# Patient Record
Sex: Male | Born: 1995 | Race: Black or African American | Hispanic: No | Marital: Single | Smoking: Never smoker
Health system: Southern US, Community
[De-identification: ages and names within clinical notes are randomized; demographics above are authoritative.]

## PROBLEM LIST (undated history)

## (undated) DIAGNOSIS — R55 Syncope and collapse: Secondary | ICD-10-CM

## (undated) DIAGNOSIS — M6282 Rhabdomyolysis: Secondary | ICD-10-CM

## (undated) DIAGNOSIS — E860 Dehydration: Secondary | ICD-10-CM

## (undated) DIAGNOSIS — N179 Acute kidney failure, unspecified: Secondary | ICD-10-CM

## (undated) HISTORY — PX: SHOULDER ARTHROSCOPY: SHX128

---

## 2013-12-23 ENCOUNTER — Ambulatory Visit
Admission: RE | Admit: 2013-12-23 | Discharge: 2013-12-23 | Disposition: A | Discharge status: Home / Self Care | Payer: Federal, State, Local not specified - PPO | Source: Ambulatory Visit | Attending: Family Medicine | Admitting: Family Medicine

## 2013-12-23 ENCOUNTER — Other Ambulatory Visit: Payer: Self-pay | Admitting: Family Medicine

## 2013-12-23 DIAGNOSIS — M25512 Pain in left shoulder: Secondary | ICD-10-CM

## 2014-12-01 ENCOUNTER — Other Ambulatory Visit: Payer: Self-pay | Admitting: Orthopedic Surgery

## 2014-12-01 DIAGNOSIS — M25512 Pain in left shoulder: Secondary | ICD-10-CM

## 2014-12-15 ENCOUNTER — Other Ambulatory Visit: Payer: Federal, State, Local not specified - PPO

## 2015-06-16 IMAGING — CT CT SHOULDER*L* W/O CM
3 series · 8 of 14 positions shown, 9 images · non-contrast
Comparison: None.

CLINICAL DATA: Remote left shoulder injury with persistent left
shoulder pain.

EXAM:
CT OF THE LEFT SHOULDER WITHOUT CONTRAST
TECHNIQUE: Multidetector CT imaging was performed according to the standard
protocol. Multiplanar CT image reconstructions were also generated.

[Series 3: left shoulder /std · axial · 0.47mm/px · z∈[-198,-132]mm · 2 of 80 slices shown, 3 images]
[im 27/80  soft-tissue]
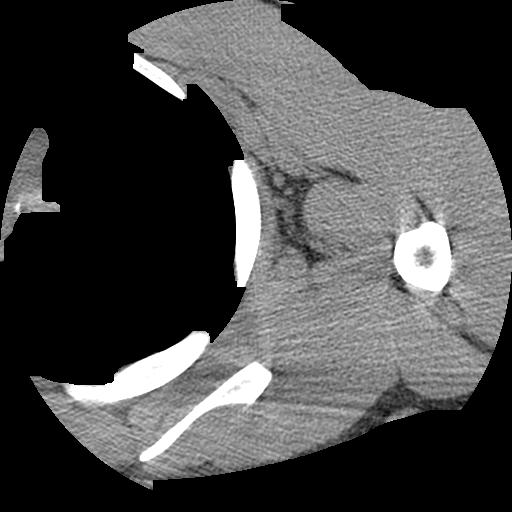
[im 27/80  bone]
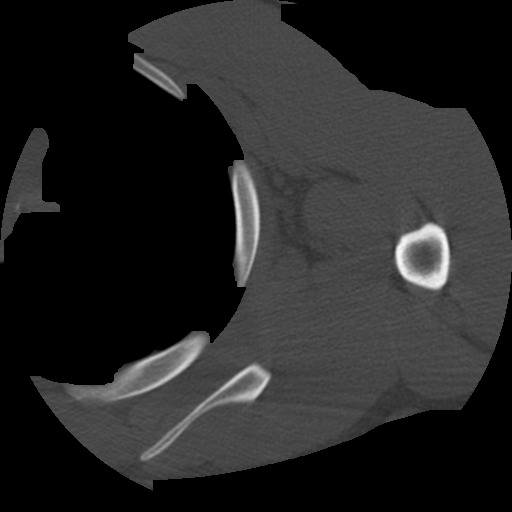
[im 53/80  bone]
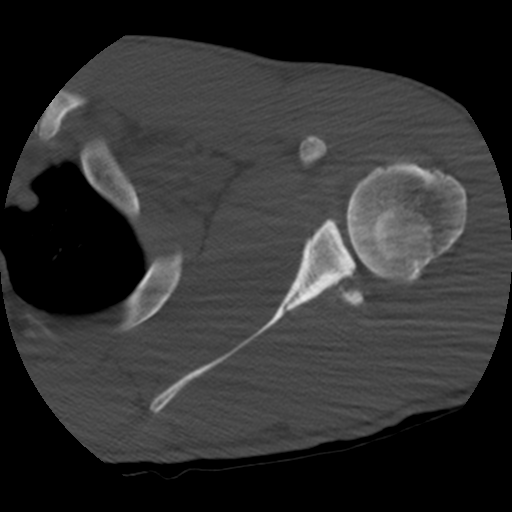

[Series 103: cor obl soft · oblique · 0.47mm/px · 3 of 121 slices shown]
[im 31/121  soft-tissue]
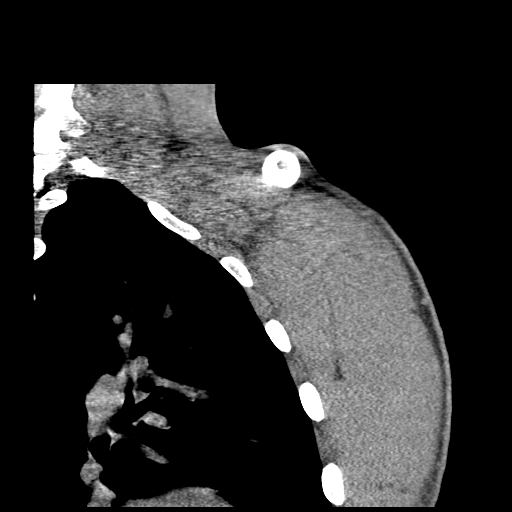
[im 61/121  soft-tissue]
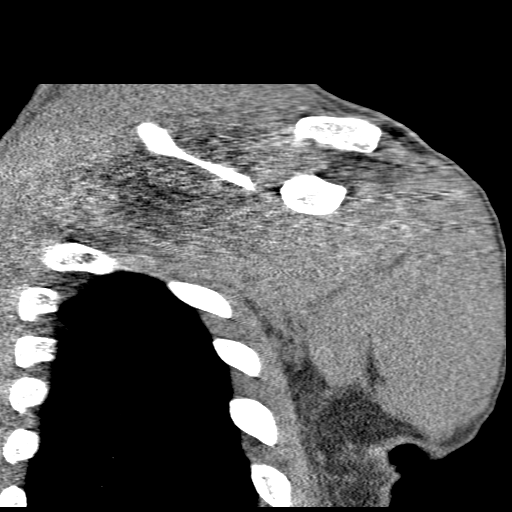
[im 91/121  soft-tissue]
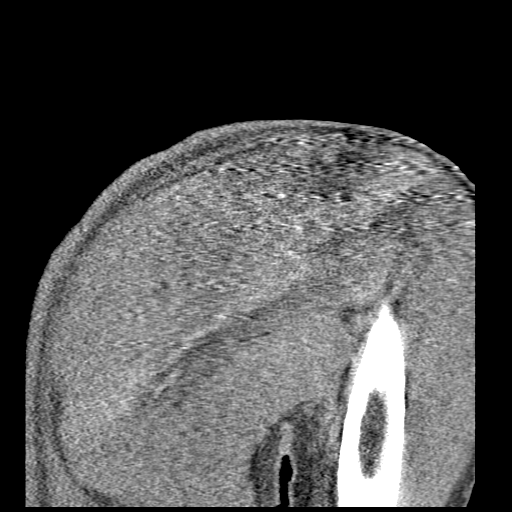

[Series 104: sag obl soft · oblique · 0.47mm/px · 3 of 121 slices shown]
[im 31/121  soft-tissue]
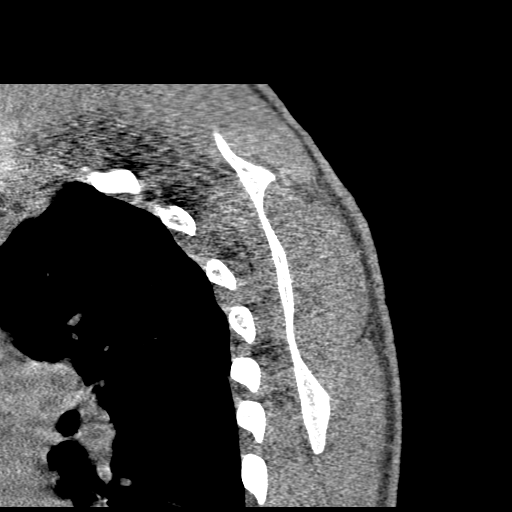
[im 61/121  soft-tissue]
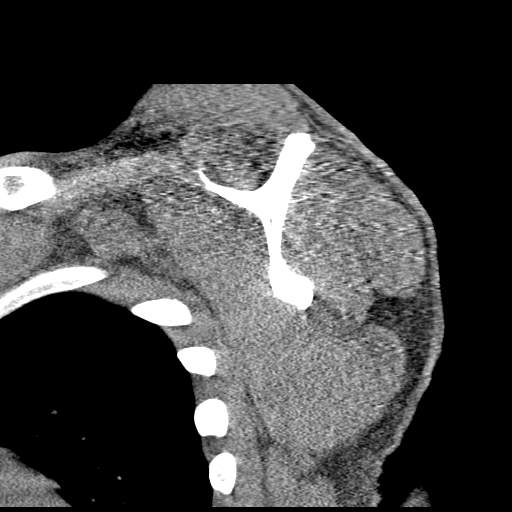
[im 91/121  soft-tissue]
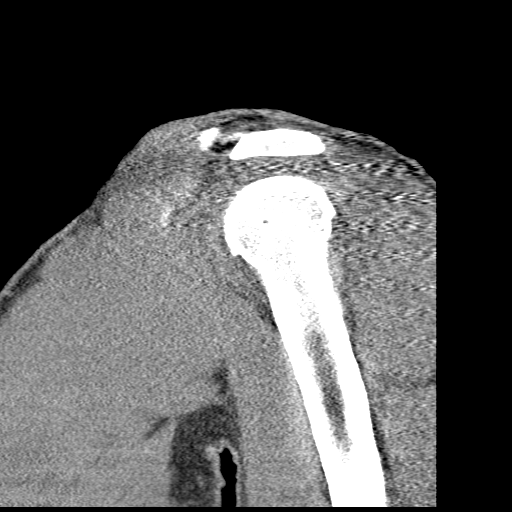

[8 of 14 positions shown; findings below may reference images not displayed]

FINDINGS: There is old ununited reverse Bankart fracture involving the
glenoid. The displaced fragment measures 21 x 15 x 8 mm. Maximum
displacement at the articular surface measures 4.5 mm.

The E humeral head is normally located. There are mild glenohumeral
joint degenerative changes with early osteophytic spurring involving
the humeral head neck junction area. The AC joint is intact. The
rotator cuff tendons are grossly normal.

The visualized left lung is clear.
IMPRESSION: Remote ununited reverse Bankart fracture involving the posterior
glenoid as discussed above. Please see 3D images.

Early glenohumeral joint degenerative changes.

## 2017-11-12 ENCOUNTER — Emergency Department (HOSPITAL_COMMUNITY): Payer: Federal, State, Local not specified - PPO

## 2017-11-12 ENCOUNTER — Inpatient Hospital Stay (HOSPITAL_COMMUNITY)
Admission: EM | Admit: 2017-11-12 | Discharge: 2017-11-14 | DRG: 558 | Disposition: A | Discharge status: Home / Self Care | Payer: Federal, State, Local not specified - PPO | Attending: Family Medicine | Admitting: Family Medicine

## 2017-11-12 ENCOUNTER — Other Ambulatory Visit: Payer: Self-pay

## 2017-11-12 ENCOUNTER — Encounter (HOSPITAL_COMMUNITY): Payer: Self-pay | Admitting: Internal Medicine

## 2017-11-12 DIAGNOSIS — I517 Cardiomegaly: Secondary | ICD-10-CM | POA: Diagnosis present

## 2017-11-12 DIAGNOSIS — R55 Syncope and collapse: Secondary | ICD-10-CM | POA: Diagnosis present

## 2017-11-12 DIAGNOSIS — E860 Dehydration: Secondary | ICD-10-CM | POA: Diagnosis present

## 2017-11-12 DIAGNOSIS — M6282 Rhabdomyolysis: Secondary | ICD-10-CM | POA: Diagnosis present

## 2017-11-12 DIAGNOSIS — N179 Acute kidney failure, unspecified: Secondary | ICD-10-CM | POA: Diagnosis present

## 2017-11-12 DIAGNOSIS — R9431 Abnormal electrocardiogram [ECG] [EKG]: Secondary | ICD-10-CM | POA: Diagnosis present

## 2017-11-12 DIAGNOSIS — I491 Atrial premature depolarization: Secondary | ICD-10-CM | POA: Diagnosis present

## 2017-11-12 DIAGNOSIS — R402360 Coma scale, best motor response, obeys commands, unspecified time: Secondary | ICD-10-CM | POA: Diagnosis present

## 2017-11-12 DIAGNOSIS — R402250 Coma scale, best verbal response, oriented, unspecified time: Secondary | ICD-10-CM | POA: Diagnosis present

## 2017-11-12 DIAGNOSIS — R402140 Coma scale, eyes open, spontaneous, unspecified time: Secondary | ICD-10-CM | POA: Diagnosis present

## 2017-11-12 DIAGNOSIS — R748 Abnormal levels of other serum enzymes: Secondary | ICD-10-CM | POA: Diagnosis present

## 2017-11-12 DIAGNOSIS — Z791 Long term (current) use of non-steroidal anti-inflammatories (NSAID): Secondary | ICD-10-CM

## 2017-11-12 HISTORY — DX: Syncope and collapse: R55

## 2017-11-12 HISTORY — DX: Acute kidney failure, unspecified: N17.9

## 2017-11-12 HISTORY — DX: Dehydration: E86.0

## 2017-11-12 HISTORY — DX: Rhabdomyolysis: M62.82

## 2017-11-12 LAB — BASIC METABOLIC PANEL
Anion gap: 12 (ref 5–15)
BUN: 21 mg/dL — AB (ref 6–20)
CALCIUM: 9.8 mg/dL (ref 8.9–10.3)
CO2: 25 mmol/L (ref 22–32)
CREATININE: 2.37 mg/dL — AB (ref 0.61–1.24)
Chloride: 103 mmol/L (ref 98–111)
GFR calc Af Amer: 43 mL/min — ABNORMAL LOW (ref >60)
GFR, EST NON AFRICAN AMERICAN: 37 mL/min — AB (ref >60)
GLUCOSE: 82 mg/dL (ref 70–99)
Potassium: 4.4 mmol/L (ref 3.5–5.1)
Sodium: 140 mmol/L (ref 135–145)

## 2017-11-12 LAB — URINALYSIS, ROUTINE W REFLEX MICROSCOPIC
Bilirubin Urine: NEGATIVE
GLUCOSE, UA: NEGATIVE mg/dL
KETONES UR: 20 mg/dL — AB
Leukocytes, UA: NEGATIVE
NITRITE: NEGATIVE
PH: 5 (ref 5.0–8.0)
Protein, ur: 100 mg/dL — AB
Specific Gravity, Urine: 1.031 — ABNORMAL HIGH (ref 1.005–1.030)

## 2017-11-12 LAB — RAPID URINE DRUG SCREEN, HOSP PERFORMED
AMPHETAMINES: NOT DETECTED
BARBITURATES: NOT DETECTED
Benzodiazepines: NOT DETECTED
COCAINE: NOT DETECTED
Opiates: NOT DETECTED
TETRAHYDROCANNABINOL: NOT DETECTED

## 2017-11-12 LAB — CBC
HCT: 45.5 % (ref 39.0–52.0)
HEMOGLOBIN: 15 g/dL (ref 13.0–17.0)
MCH: 28.9 pg (ref 26.0–34.0)
MCHC: 33 g/dL (ref 30.0–36.0)
MCV: 87.7 fL (ref 78.0–100.0)
PLATELETS: 194 10*3/uL (ref 150–400)
RBC: 5.19 MIL/uL (ref 4.22–5.81)
RDW: 12.8 % (ref 11.5–15.5)
WBC: 8.3 10*3/uL (ref 4.0–10.5)

## 2017-11-12 LAB — TSH: TSH: 0.725 u[IU]/mL (ref 0.350–4.500)

## 2017-11-12 LAB — CK: Total CK: 9983 U/L — ABNORMAL HIGH (ref 49–397)

## 2017-11-12 LAB — CBG MONITORING, ED: Glucose-Capillary: 74 mg/dL (ref 70–99)

## 2017-11-12 MED ORDER — SODIUM CHLORIDE 0.9 % IV SOLN
INTRAVENOUS | Status: DC
  Administered 2017-11-12 – 2017-11-14 (×6): via INTRAVENOUS

## 2017-11-12 MED ORDER — SODIUM CHLORIDE 0.9 % IV BOLUS
1000.0000 mL | Freq: Once | INTRAVENOUS | Status: AC
  Administered 2017-11-12: 1000 mL via INTRAVENOUS

## 2017-11-12 MED ORDER — SODIUM CHLORIDE 0.9 % IV BOLUS
2000.0000 mL | Freq: Once | INTRAVENOUS | Status: AC
  Administered 2017-11-12: 2000 mL via INTRAVENOUS

## 2017-11-12 MED ORDER — HEPARIN SODIUM (PORCINE) 5000 UNIT/ML IJ SOLN
5000.0000 [IU] | Freq: Three times a day (TID) | INTRAMUSCULAR | Status: DC
  Administered 2017-11-12 – 2017-11-13 (×2): 5000 [IU] via SUBCUTANEOUS
  Filled 2017-11-12 (×2): qty 1

## 2017-11-12 NOTE — ED Provider Notes (Signed)
[facility] EMERGENCY DEPARTMENT Provider Note   CSN: [id number] Arrival date & time: 11/12/17  1226     History   Chief Complaint Chief Complaint  Patient presents with  . Loss of Consciousness    HPI George Ramirez is a 22 y.o. male.  HPI Patient presents to the emergency department with a syncopal episode that occurred right after football practice.  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  The patient states that he was not feeling bad prior to this episode.  Patient states that have been practicing very hard over the last week.  Patient states nothing seemed to make the condition better or worse.  The patient denies chest pain, shortness of breath, headache,blurred vision, neck pain, fever, cough, weakness, numbness, dizziness, anorexia, edema, abdominal pain, nausea, vomiting, diarrhea, rash, back pain, dysuria, hematemesis, bloody stool,  History reviewed. No pertinent past medical history.  There are no active problems to display for this patient.   History reviewed. No pertinent surgical history.      Home Medications    Prior to Admission medications   Medication Sig Start Date End Date Taking? Authorizing Provider  naproxen sodium (ALEVE) 220 MG tablet Take 440 mg by mouth.   Yes [provider]    Family History No family history on file.  Social History Social History   Tobacco Use  . Smoking status: Not on file  Substance Use Topics  . Alcohol use: Not on file  . Drug use: Not on file     Allergies   Patient has no known allergies.   Review of Systems Review of Systems All other systems negative except as documented in the HPI. All pertinent positives and negatives as reviewed in the HPI.  Physical Exam Updated Vital Signs BP 136/67   Pulse 86   Temp 98.4 F (36.9 C) (Oral)   Resp (!) 22   Ht 6\' 3"  (1.905 m)   Wt 115.7 kg (255 lb)   SpO2 99%   BMI 31.87 kg/m    Physical Exam  Constitutional: He is oriented to person, place, and time. He appears well-developed and well-nourished. No distress.  HENT:  Head: Normocephalic and atraumatic.  Mouth/Throat: Oropharynx is clear and moist.  Eyes: Pupils are equal, round, and reactive to light.  Neck: Normal range of motion. Neck supple.  Cardiovascular: Normal rate, regular rhythm and normal heart sounds. Exam reveals no gallop and no friction rub.  No murmur heard. Pulmonary/Chest: Effort normal and breath sounds normal. No respiratory distress. He has no wheezes.  Abdominal: Soft. Bowel sounds are normal. He exhibits no distension. There is no tenderness.  Neurological: He is alert and oriented to person, place, and time. He exhibits normal muscle tone. Coordination normal.  Skin: Skin is warm and dry. Capillary refill takes less than 2 seconds. No rash noted. No erythema.  Psychiatric: He has a normal mood and affect. His behavior is normal.  Nursing note and vitals reviewed.    ED Treatments / Results  Labs (all labs ordered are listed, but only abnormal results are displayed) Labs Reviewed  BASIC METABOLIC PANEL - Abnormal; Notable for the following components:      Result Value   BUN 21 (*)    Creatinine, Ser 2.37 (*)    GFR calc non Af Amer 37 (*)    GFR calc Af Amer 43 (*)    All other components within normal limits  CK - Abnormal; Notable for the following components:   Total CK 9,983 (*)    All other components within normal limits  CBC  URINALYSIS, ROUTINE W REFLEX MICROSCOPIC  RAPID URINE DRUG SCREEN, HOSP PERFORMED  CBG MONITORING, ED    EKG EKG Interpretation  Date/Time:  Wednesday November 12 2017 12:41:51 EDT Ventricular Rate:  93 PR Interval:    QRS Duration: 73 QT Interval:  340 QTC Calculation: 423 R Axis:   78 Text Interpretation:  Sinus rhythm Atrial premature complex LVH by voltage Inferior infarct, acute (LCx) Anterior ST elevation, probably due to LVH Lateral  leads are also involved No previous tracing Confirmed by Gwyneth SproutPlunkett, Whitney ([id number]) on 11/12/2017 2:00:48 PM   Radiology Dg Chest 2 View  Result Date: 11/12/2017 CLINICAL DATA:  Syncope. EXAM: CHEST - 2 VIEW COMPARISON:  None. FINDINGS: The heart size and mediastinal contours are within normal limits. Both lungs are clear. No pneumothorax or pleural effusion is noted. The visualized skeletal structures are unremarkable. IMPRESSION: No active cardiopulmonary disease. Electronically Signed   By: Lupita RaiderJames  Green Jr, M.D.   On: 11/12/2017 13:41    Procedures Procedures (including critical care time)  Medications Ordered in ED Medications  sodium chloride 0.9 % bolus 1,000 mL (1,000 mLs Intravenous New Bag/Given 11/12/17 1458)  sodium chloride 0.9 % bolus 2,000 mL (2,000 mLs Intravenous New Bag/Given 11/12/17 1419)     Initial Impression / Assessment and Plan / ED Course  I have reviewed the triage vital signs and the nursing notes.  Pertinent labs & imaging results that were available during my care of the patient were reviewed by me and considered in my medical decision making (see chart for details).    CRITICAL CARE Performed by: Jamesetta Orleanshristopher W Danese Dorsainvil Total critical care time: 40 minutes Critical care time was exclusive of separately billable procedures and treating other patients. Critical care was necessary to treat or prevent imminent or life-threatening deterioration. Critical care was time spent personally by me on the following activities: development of treatment plan with patient and/or surrogate as well as nursing, discussions with consultants, evaluation of patient's response to treatment, examination of patient, obtaining history from patient or surrogate, ordering and performing treatments and interventions, ordering and review of laboratory studies, ordering and review of radiographic studies, pulse oximetry and re-evaluation of patient's condition.  Patient be treated in the hospital  for rhabdomyolysis.  I was given multiple rounds of IV fluids here in the emergency department.  Patient is remained stable and his vital signs have been stable as well.  Patient is advised the plan and all questions were answered.  Spoke with the [facility] who will admit the patient for further evaluation. Final Clinical Impressions(s) / ED Diagnoses   Final diagnoses:  None    ED Discharge Orders    None       Charlestine NightLawyer, Kadeja Granada, PA-C 11/12/17 1545    Gwyneth SproutPlunkett, Whitney, MD 11/14/17 1445

## 2017-11-12 NOTE — ED Notes (Addendum)
Phlebotomy aware of need for blood draw

## 2017-11-12 NOTE — ED Notes (Signed)
Patient transported to X-ray 

## 2017-11-12 NOTE — ED Notes (Signed)
Attempted report x1. 

## 2017-11-12 NOTE — ED Notes (Signed)
Pt given urinal and instructed to use it to void. Pt aware of need for urine specimen.

## 2017-11-12 NOTE — H&P (Signed)
History and Physical    George Ramirez [id number] DOB: 1995-08-23 DOA: 11/12/2017  PCP: Patient, No Pcp Per Patient coming from: home ([facility] [employer] [facility])  Chief Complaint: syncope  HPI: George Ramirez is a 22 y.o. male with no past medical hx presents to the emergency Department chief complaint of syncope. Initial evaluation reveals acute kidney injury likely, elevated CK related to dehydration in setting of football practice. [facility] are asked to admit.  Information is obtained from the patient and the chart. Patient states the ball practice began last week and they practiced "without gear" for 1 week. This week today being the third day they practiced "with gear and hitting". Patient states he's been in his usual state of health eating and drinking his normal amount. He does characterize the practices as "hard". He remembers going to the locker room and being on the ground. He said he could hear people calling his name but he could not respond. He denies headache dizziness chest pain palpitation shortness of breath. He denies any nausea vomiting diarrhea. He denies dysuria hematuria frequency or urgency. Numbness or tingling of his extremities. No headache or visual disturbances. No diarrhea constipation melena or bright red blood per rectum.he denies any performance enhancement supplements   ED Course: in the emergency department he's afebrile hemodynamically stable and not hypoxic. He is provided with 3 L of normal saline.  Review of Systems: As per HPI otherwise all other systems reviewed and are negative.   Ambulatory Status: ambulates independently is independent with ADLs  Past Medical History:  Diagnosis Date  . Acute kidney injury (HCC)   . Dehydration   . Rhabdomyolysis   . Syncope     History reviewed. No pertinent surgical history.  Social History   Socioeconomic History  . Marital status: Single    Spouse name: Not on file  . Number of children: Not on file  .  Years of education: Not on file  . Highest education level: Not on file  Occupational History  . Not on file  Social Needs  . Financial resource strain: Not on file  . Food insecurity:    Worry: Not on file    Inability: Not on file  . Transportation needs:    Medical: Not on file    Non-medical: Not on file  Tobacco Use  . Smoking status: Not on file  Substance and Sexual Activity  . Alcohol use: Not on file  . Drug use: Not on file  . Sexual activity: Not on file  Lifestyle  . Physical activity:    Days per week: Not on file    Minutes per session: Not on file  . Stress: Not on file  Relationships  . Social connections:    Talks on phone: Not on file    Gets together: Not on file    Attends religious service: Not on file    Active member of club or organization: Not on file    Attends meetings of clubs or organizations: Not on file    Relationship status: Not on file  . Intimate partner violence:    Fear of current or ex partner: Not on file    Emotionally abused: Not on file    Physically abused: Not on file    Forced sexual activity: Not on file  Other Topics Concern  . Not on file  Social History Narrative  . Not on file    No Known Allergies  Family History  Problem Relation  Age of Onset  . Hypertension Mother     Prior to Admission medications   Medication Sig Start Date End Date Taking? Authorizing Provider  naproxen sodium (ALEVE) 220 MG tablet Take 440 mg by mouth.   Yes [provider]    Physical Exam: Vitals:   11/12/17 1232 11/12/17 1236 11/12/17 1403 11/12/17 1430  BP:   136/80 136/67  Pulse:   81 86  Resp:   13 (!) 22  Temp:   98.4 F (36.9 C)   TempSrc:   Oral   SpO2: 98%  98% 99%  Weight:  115.7 kg (255 lb)    Height:  6\' 3"  (1.905 m)       General:  Appears calm and comfortable lying in bed in no acute distress Eyes:  PERRL, EOMI, normal lids, iris ENT:  grossly normal hearing, lips & tongue, mucous membranes of his  mouth are moist and pink Neck:  no LAD, masses or thyromegaly Cardiovascular:  RRR, no m/r/g. No LE edema.  Respiratory:  CTA bilaterally, no w/r/r. Normal respiratory effort. Abdomen:  soft, ntnd, NABS Skin:  no rash or induration seen on limited exam Musculoskeletal:  grossly normal tone BUE/BLE, good ROM, no bony abnormality Psychiatric:  grossly normal mood and affect, speech fluent and appropriate, AOx3 Neurologic:  CN 2-12 grossly intact, moves all extremities in coordinated fashion, sensation intact  Labs on Admission: I have personally reviewed following labs and imaging studies  CBC: Recent Labs  Lab 11/12/17 1239  WBC 8.3  HGB 15.0  HCT 45.5  MCV 87.7  PLT 194   Basic Metabolic Panel: Recent Labs  Lab 11/12/17 1239  NA 140  K 4.4  CL 103  CO2 25  GLUCOSE 82  BUN 21*  CREATININE 2.37*  CALCIUM 9.8   GFR: Estimated Creatinine Clearance: 66.5 mL/min (A) (by C-G formula based on SCr of 2.37 mg/dL (H)). Liver Function Tests: No results for input(s): AST, ALT, ALKPHOS, BILITOT, PROT, ALBUMIN in the last 168 hours. No results for input(s): LIPASE, AMYLASE in the last 168 hours. No results for input(s): AMMONIA in the last 168 hours. Coagulation Profile: No results for input(s): INR, PROTIME in the last 168 hours. Cardiac Enzymes: Recent Labs  Lab 11/12/17 1239  CKTOTAL 9,983*   BNP (last 3 results) No results for input(s): PROBNP in the last 8760 hours. HbA1C: No results for input(s): HGBA1C in the last 72 hours. CBG: Recent Labs  Lab 11/12/17 1300  GLUCAP 74   Lipid Profile: No results for input(s): CHOL, HDL, LDLCALC, TRIG, CHOLHDL, LDLDIRECT in the last 72 hours. Thyroid Function Tests: No results for input(s): TSH, T4TOTAL, FREET4, T3FREE, THYROIDAB in the last 72 hours. Anemia Panel: No results for input(s): VITAMINB12, FOLATE, FERRITIN, TIBC, IRON, RETICCTPCT in the last 72 hours. Urine analysis: No results found for: COLORURINE,  APPEARANCEUR, LABSPEC, PHURINE, GLUCOSEU, HGBUR, BILIRUBINUR, KETONESUR, PROTEINUR, UROBILINOGEN, NITRITE, LEUKOCYTESUR  Creatinine Clearance: Estimated Creatinine Clearance: 66.5 mL/min (A) (by C-G formula based on SCr of 2.37 mg/dL (H)).  Sepsis Labs: @LABRCNTIP (procalcitonin:4,lacticidven:4) )No results found for this or any previous visit (from the past 240 hour(s)).   Radiological Exams on Admission: Dg Chest 2 View  Result Date: 11/12/2017 CLINICAL DATA:  Syncope. EXAM: CHEST - 2 VIEW COMPARISON:  None. FINDINGS: The heart size and mediastinal contours are within normal limits. Both lungs are clear. No pneumothorax or pleural effusion is noted. The visualized skeletal structures are unremarkable. IMPRESSION: No active cardiopulmonary disease. Electronically Signed   By:  Lupita Raider, M.D.   On: 11/12/2017 13:41    EKG: NSR  Assessment/Plan Principal Problem:   Syncope and collapse Active Problems:   Dehydration   Acute kidney injury (HCC)   Rhabdomyolysis   Elevated CK   #1. Syncope and collapse. Likely related to aki secondary to dehydration. ekg without acute changes. Chest xray no active cardiopulmonary disease. CK 9982, creatinine 2.37. Neuro exam benign. Hemodynamically stable and not hypoxic appeearing -admit to tele -continue vigorous IV fluids -check orthostatic vs  #2. Elevated CK/rhabdomyolosis. Secondary to dehydration. CK 9982 on admission. Denies decreased oral intake. Likely secondary to football practice in heat. He is provided 3L NS in ED -continue vigorous IV fluids -monitor intake and output -recheck in am  #3. AKI. Related to dehydration. Creatinine 2.37. Bun 21.  -hold nephrotoxins -vigorous IV fluids as noted above -follow urinalysis -follow UDS -monitor urine output -recheck in am  DVT prophylaxis: heparin  Code Status: full  Family Communication: none present  Disposition Plan: home 24-36 hous  Consults called: none  Admission status:  inpatient    Gwenyth Bender NP [facility]  If 7PM-7AM, please contact night-coverage [email or web address] Password [facility]  11/12/2017, 4:29 PM

## 2017-11-12 NOTE — ED Triage Notes (Signed)
Pt here via [facility] after an unwitnessed syncopal episode in [facility] [employer] this afternoon. BLS truck arrived initially and provided respiration assistance d/t pt shallow respirations. EMS reports pt was unresponsive to painful stimuli for approx 5 minutes. Pt began moaning to verbal stimuli and is now A/Ox4. Pt given 400mLs fluid by EMS. Vital signs stable at this time. Patient denies pain, reports feeling "tired."

## 2017-11-13 DIAGNOSIS — E860 Dehydration: Secondary | ICD-10-CM

## 2017-11-13 DIAGNOSIS — M6282 Rhabdomyolysis: Principal | ICD-10-CM

## 2017-11-13 DIAGNOSIS — R748 Abnormal levels of other serum enzymes: Secondary | ICD-10-CM

## 2017-11-13 DIAGNOSIS — N179 Acute kidney failure, unspecified: Secondary | ICD-10-CM

## 2017-11-13 LAB — BASIC METABOLIC PANEL
Anion gap: 10 (ref 5–15)
BUN: 20 mg/dL (ref 6–20)
CHLORIDE: 106 mmol/L (ref 98–111)
CO2: 25 mmol/L (ref 22–32)
CREATININE: 1.5 mg/dL — AB (ref 0.61–1.24)
Calcium: 8.2 mg/dL — ABNORMAL LOW (ref 8.9–10.3)
GFR calc Af Amer: >60 mL/min (ref >60)
GFR calc non Af Amer: >60 mL/min (ref >60)
Glucose, Bld: 85 mg/dL (ref 70–99)
POTASSIUM: 3.8 mmol/L (ref 3.5–5.1)
SODIUM: 141 mmol/L (ref 135–145)

## 2017-11-13 LAB — CK: Total CK: 6206 U/L — ABNORMAL HIGH (ref 49–397)

## 2017-11-13 LAB — CBC
HEMATOCRIT: 40.1 % (ref 39.0–52.0)
Hemoglobin: 12.9 g/dL — ABNORMAL LOW (ref 13.0–17.0)
MCH: 28.9 pg (ref 26.0–34.0)
MCHC: 32.2 g/dL (ref 30.0–36.0)
MCV: 89.7 fL (ref 78.0–100.0)
PLATELETS: 157 10*3/uL (ref 150–400)
RBC: 4.47 MIL/uL (ref 4.22–5.81)
RDW: 12.9 % (ref 11.5–15.5)
WBC: 7.1 10*3/uL (ref 4.0–10.5)

## 2017-11-13 LAB — HIV ANTIBODY (ROUTINE TESTING W REFLEX): HIV SCREEN 4TH GENERATION: NONREACTIVE

## 2017-11-13 NOTE — Progress Notes (Signed)
  PROGRESS NOTE  George Ramirez [id number] DOB: 11-01-1995 DOA: 11/12/2017 PCP: Patient, No Pcp Per  Brief Narrative: 22 year old man who had an episode of syncope during football practice.  Admitted for rhabdomyolysis with acute kidney injury, dehydration.  Assessment/Plan Rhabdomyolysis with elevated CK, acute kidney injury. --CK trending down with aggressive hydration but still elevated. --Continue aggressive IV hydration, check CK in a.m.  Acute kidney injury, dehydration, secondary to vigorous outdoor exercise --Urine output adequate, renal function improving. --Continue vigorous IV fluids, will increase rate to 200 mL/h --Check BMP in a.m.  Syncope in the context of vigorous outdoor exercise, dehydration, acute kidney injury. --EKG showed sinus rhythm with LVH and repolarization abnormality. --No further inpatient evaluation planned.   DVT prophylaxis: early ambulation Code Status: Full Family Communication: none Disposition Plan: likely home 8/9    Brendia Sacksaniel Goodrich, MD  [facility] Direct contact: [phone] --Via amion app OR  --[email or web address]; password [email or web address]  7PM-7AM contact night coverage as above 11/13/2017, 11:25 AM  LOS: 1 day   Consultants:    Procedures:    Antimicrobials:    Interval history/Subjective: Feels good, no complaints.  Urine getting back to normal  Objective: Vitals:  Vitals:   11/13/17 0507 11/13/17 0842  BP: 123/79 (!) 158/96  Pulse: 65 72  Resp: 18 18  Temp: 98 F (36.7 C) 98.4 F (36.9 C)  SpO2: 100% 97%    Exam:  Constitutional:  . Appears calm and comfortable Respiratory:  . CTA bilaterally, no w/r/r.  . Respiratory effort normal. Cardiovascular:  . RRR, no m/r/g . No LE extremity edema   Psychiatric:  . Mental status o Mood, affect appropriate I have personally reviewed the following:  Urine output 1900 Labs:  Creatinine trending down 2.37, 1.50  Total CK trending down 9983, 6206  CBC  unremarkable  TSH unremarkable  Scheduled Meds: . heparin  5,000 Units Subcutaneous Q8H   Continuous Infusions: . sodium chloride 200 mL/hr at 11/13/17 1057    Principal Problem:   Rhabdomyolysis Active Problems:   Dehydration   Acute kidney injury (HCC)   Syncope and collapse   Elevated CK   LOS: 1 day

## 2017-11-14 LAB — BASIC METABOLIC PANEL
ANION GAP: 10 (ref 5–15)
BUN: 11 mg/dL (ref 6–20)
CHLORIDE: 101 mmol/L (ref 98–111)
CO2: 28 mmol/L (ref 22–32)
Calcium: 9.3 mg/dL (ref 8.9–10.3)
Creatinine, Ser: 1.17 mg/dL (ref 0.61–1.24)
GFR calc non Af Amer: >60 mL/min (ref >60)
GLUCOSE: 104 mg/dL — AB (ref 70–99)
POTASSIUM: 4 mmol/L (ref 3.5–5.1)
Sodium: 139 mmol/L (ref 135–145)

## 2017-11-14 LAB — CK: Total CK: 3552 U/L — ABNORMAL HIGH (ref 49–397)

## 2017-11-14 NOTE — Progress Notes (Signed)
Patient Discharge: Disposition: Patient discharged to home. Education: Reviewed discharge instructions, verbalized understanding. IV: Discontinued IV before discharge. Telemetry: N/A Transportation: Patient walked out of the unit and refused to be escorted. Belongings: Patient took all his belongings with him.

## 2017-11-14 NOTE — Discharge Summary (Signed)
Physician Discharge Summary  Shannan HarperJustin Belvin [id number] DOB: [birth year] DOA: 11/12/2017  PCP: Patient, No Pcp Per  Admit date: 11/12/2017 Discharge date: 11/14/2017  Recommendations for Outpatient Follow-up:  1. Routine care     Discharge Diagnoses:  1. Rhabdomyolysis with elevated CK, acute kidney injury. 2. Acute kidney injury, dehydration 3. Syncope in the context of vigorous outdoor exercise, dehydration, acute kidney injury.  Discharge Condition: improved Disposition: home  Diet recommendation: regular  Filed Weights   11/12/17 1236  Weight: 115.7 kg    History of present illness:  22 year old man who had an episode of syncope during football practice.  Admitted for rhabdomyolysis with acute kidney injury, dehydration.  Hospital Course:  Patient was treated with aggressive IV fluids with gradual clinical improvement.  No complications noted.  Individual issues as below.  Rhabdomyolysis with elevated CK, acute kidney injury. --Treated aggressively with IV fluids.  Renal function is returned to baseline.  CK trending down rapidly.  Acute kidney injury, dehydration, secondary to vigorous outdoor exercise --With vigorous IV fluids.  Syncope in the context of vigorous outdoor exercise, dehydration, acute kidney injury. --EKG showed sinus rhythm with LVH and repolarization abnormality. --No further inpatient evaluation planned.  Today's assessment: S: feels well, no complaints. O: Vitals:  Vitals:   11/14/17 0455 11/14/17 0919  BP: 135/83 (!) 158/83  Pulse: (!) 57 82  Resp: 16 18  Temp: 97.9 F (36.6 C) 98.5 F (36.9 C)  SpO2: 100% 100%    Constitutional:  . Appears calm and comfortable Respiratory:  . CTA bilaterally, no w/r/r.  . Respiratory effort normal. Cardiovascular:  . RRR, no m/r/g . No LE extremity edema   Psychiatric:  . Mental status o Mood, affect appropriate  CK down to 3552 Creatinine down to 1.17  Discharge  Instructions  Discharge Instructions    Activity as tolerated - No restrictions   Complete by:  As directed    Diet general   Complete by:  As directed    Discharge instructions   Complete by:  As directed    Call your physician or seek immediate medical attention for pain, decreased urination, swelling, dark urine or worsening of condition.     Allergies as of 11/14/2017   No Known Allergies     Medication List    STOP taking these medications   naproxen sodium 220 MG tablet Commonly known as:  ALEVE      No Known Allergies  The results of significant diagnostics from this hospitalization (including imaging, microbiology, ancillary and laboratory) are listed below for reference.    Significant Diagnostic Studies: Dg Chest 2 View  Result Date: 11/12/2017 CLINICAL DATA:  Syncope. EXAM: CHEST - 2 VIEW COMPARISON:  None. FINDINGS: The heart size and mediastinal contours are within normal limits. Both lungs are clear. No pneumothorax or pleural effusion is noted. The visualized skeletal structures are unremarkable. IMPRESSION: No active cardiopulmonary disease. Electronically Signed   By: Lupita RaiderJames  Green Jr, M.D.   On: 11/12/2017 13:41    Labs: Basic Metabolic Panel: Recent Labs  Lab 11/12/17 1239 11/13/17 0509 11/14/17 0903  NA 140 141 139  K 4.4 3.8 4.0  CL 103 106 101  CO2 25 25 28   GLUCOSE 82 85 104*  BUN 21* 20 11  CREATININE 2.37* 1.50* 1.17  CALCIUM 9.8 8.2* 9.3   CBC: Recent Labs  Lab 11/12/17 1239 11/13/17 0509  WBC 8.3 7.1  HGB 15.0 12.9*  HCT 45.5 40.1  MCV 87.7 89.7  PLT 194  157   Cardiac Enzymes: Recent Labs  Lab 11/12/17 1239 11/13/17 0509 11/14/17 0903  CKTOTAL 9,983* 6,206* 3,552*    CBG: Recent Labs  Lab 11/12/17 1300  GLUCAP 74    Principal Problem:   Rhabdomyolysis Active Problems:   Dehydration   Acute kidney injury (HCC)   Syncope and collapse   Elevated CK   Time coordinating discharge: 20 minutes  Signed:  Brendia Sacks, MD [facility] 11/14/2017, 3:08 PM

## 2019-05-06 IMAGING — CR DG CHEST 2V
2 series · 2 of 2 positions shown · non-contrast
Comparison: None.

CLINICAL DATA: Syncope.

EXAM:
CHEST - 2 VIEW

[chest pa]
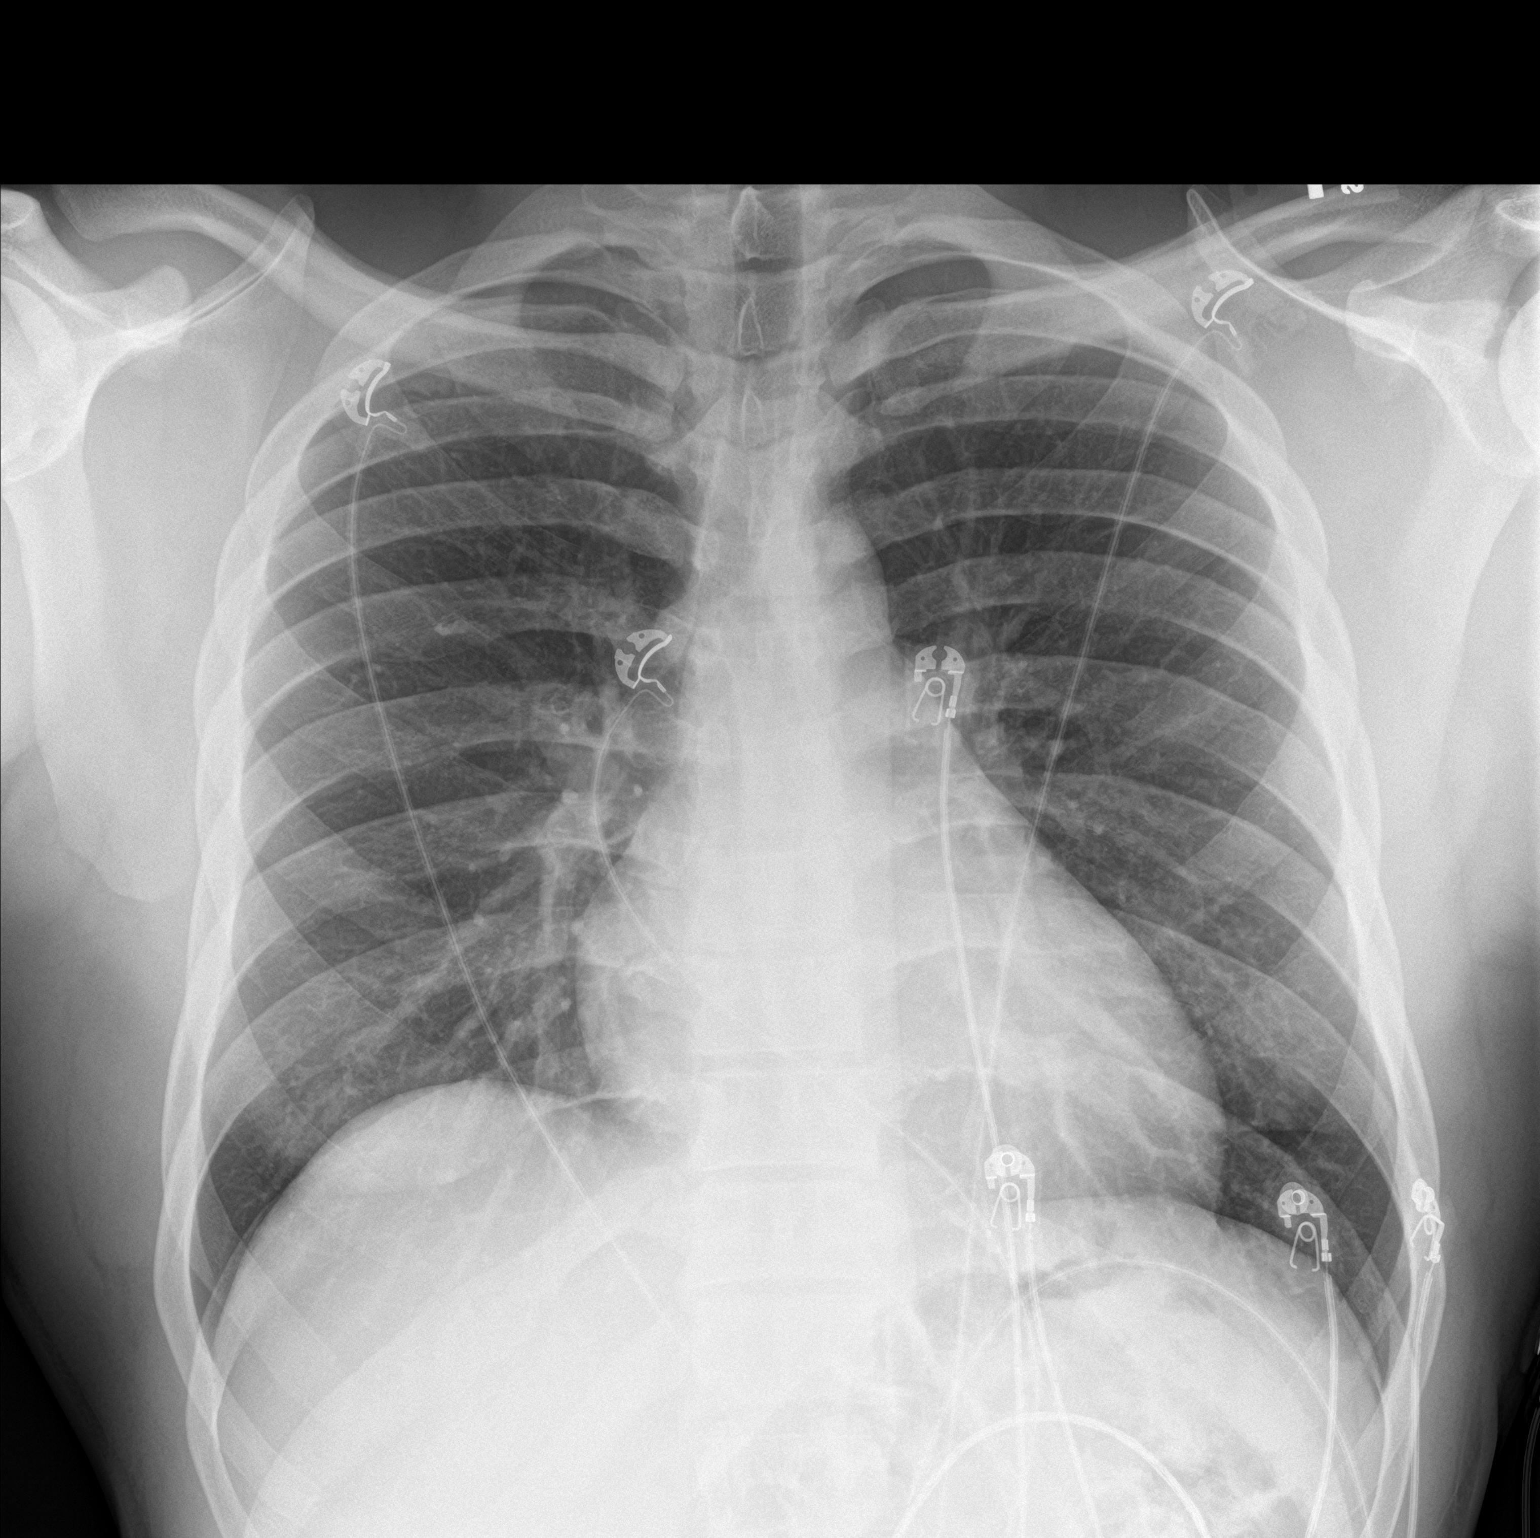

[chest lat]
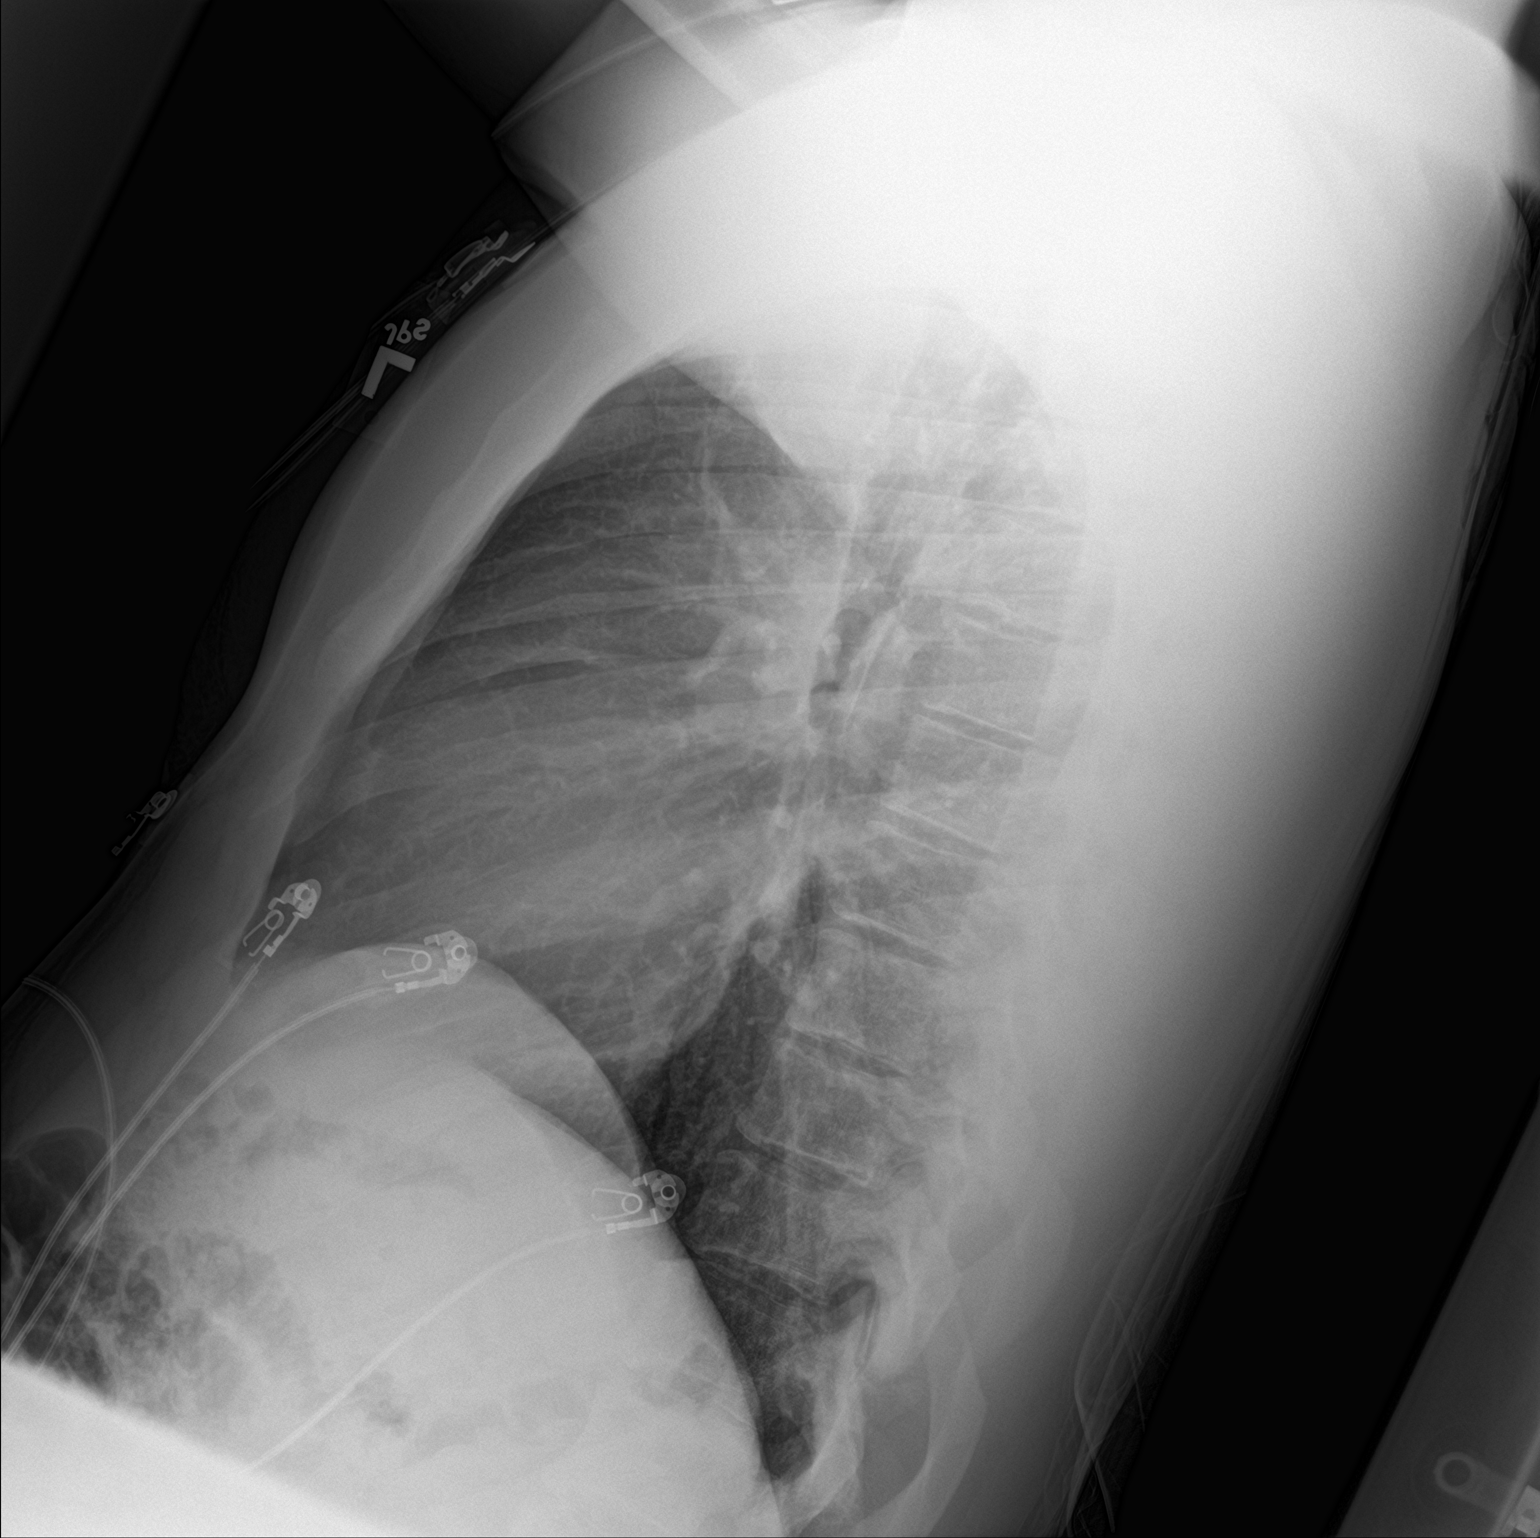

[2 of 2 positions shown; findings below may reference images not displayed]

FINDINGS: The heart size and mediastinal contours are within normal limits.
Both lungs are clear. No pneumothorax or pleural effusion is noted.
The visualized skeletal structures are unremarkable.
IMPRESSION: No active cardiopulmonary disease.
